# Patient Record
Sex: Female | Born: 1965 | Race: White | Hispanic: No | Marital: Married
Health system: Southern US, Community
[De-identification: ages and names within clinical notes are randomized; demographics above are authoritative.]

---

## 1999-07-06 ENCOUNTER — Inpatient Hospital Stay (HOSPITAL_COMMUNITY): Admission: AD | Admit: 1999-07-06 | Discharge: 1999-07-08 | Payer: Self-pay | Admitting: Obstetrics and Gynecology

## 2018-10-11 ENCOUNTER — Other Ambulatory Visit (HOSPITAL_COMMUNITY)
Admission: RE | Admit: 2018-10-11 | Discharge: 2018-10-11 | Disposition: A | Discharge status: Home / Self Care | Payer: BC Managed Care – PPO | Source: Ambulatory Visit | Attending: Family Medicine | Admitting: Family Medicine

## 2018-10-11 ENCOUNTER — Other Ambulatory Visit: Payer: Self-pay | Admitting: Family Medicine

## 2018-10-11 DIAGNOSIS — Z01419 Encounter for gynecological examination (general) (routine) without abnormal findings: Secondary | ICD-10-CM | POA: Insufficient documentation

## 2018-10-12 LAB — CYTOLOGY - PAP: Diagnosis: NEGATIVE

## 2020-01-28 ENCOUNTER — Ambulatory Visit: Payer: BC Managed Care – PPO

## 2020-10-09 ENCOUNTER — Emergency Department: Payer: No Typology Code available for payment source

## 2020-10-09 ENCOUNTER — Other Ambulatory Visit: Payer: Self-pay

## 2020-10-09 ENCOUNTER — Emergency Department
Admission: EM | Admit: 2020-10-09 | Discharge: 2020-10-09 | Disposition: A | Discharge status: Home / Self Care | Payer: No Typology Code available for payment source | Attending: Emergency Medicine | Admitting: Emergency Medicine

## 2020-10-09 DIAGNOSIS — Y92212 Middle school as the place of occurrence of the external cause: Secondary | ICD-10-CM | POA: Diagnosis not present

## 2020-10-09 DIAGNOSIS — S82142A Displaced bicondylar fracture of left tibia, initial encounter for closed fracture: Secondary | ICD-10-CM | POA: Diagnosis not present

## 2020-10-09 DIAGNOSIS — W010XXA Fall on same level from slipping, tripping and stumbling without subsequent striking against object, initial encounter: Secondary | ICD-10-CM | POA: Diagnosis not present

## 2020-10-09 DIAGNOSIS — M25562 Pain in left knee: Secondary | ICD-10-CM | POA: Diagnosis present

## 2020-10-09 DIAGNOSIS — Y990 Civilian activity done for income or pay: Secondary | ICD-10-CM | POA: Diagnosis not present

## 2020-10-09 MED ORDER — OXYCODONE-ACETAMINOPHEN 5-325 MG PO TABS
1.0000 | ORAL_TABLET | Freq: Four times a day (QID) | ORAL | 0 refills | Status: AC | PRN
Start: 2020-10-09 — End: 2020-10-14

## 2020-10-09 MED ORDER — MELOXICAM 15 MG PO TABS: 15.0000 mg | ORAL_TABLET | Freq: Every day | ORAL | 0 refills | Status: AC

## 2020-10-09 NOTE — ED Triage Notes (Signed)
Pt arrives [facility] from a powder puff football game. Pt fell during game, noted L knee swelling. Unable to bear weight or bend knee. Ice pack applied. Denies LOC. A&O upon arrival.

## 2020-10-10 NOTE — ED Provider Notes (Signed)
[facility] Emergency Department Provider Note  ____________________________________________   First MD Initiated Contact with Patient 10/09/20 2057     (approximate)  I have reviewed the triage vital signs and the nursing notes.   HISTORY  Chief Complaint Knee Pain  HPI Jill Mccullough is a 54 y.o. female since the emergency department via EMS for evaluation of left knee pain.  The patient states that she was participating in a powder puff football game at a [employer] where she works, when someone grabbed her flag and her left leg hit some mud and she fell awkwardly.  She tried to get up, but immediately noticed that she was unable to weight-bear on the left side.  She was then brought into [place].  She has not had anything to treat the pain up until this point.  Worse with ambulation and range of motion.  Pain in the room is rated a 4/10 so long as she is at rest.  No history of prior injury to the left knee.  She denies any complaints in her hip or ankle.        History reviewed. No pertinent past medical history.  There are no problems to display for this patient.   History reviewed. No pertinent surgical history.  Prior to Admission medications   Medication Sig Start Date End Date Taking? Authorizing Provider  meloxicam (MOBIC) 15 MG tablet Take 1 tablet (15 mg total) by mouth daily. 10/09/20 11/08/20  Lucy Chris, PA  oxyCODONE-acetaminophen (PERCOCET) 5-325 MG tablet Take 1 tablet by mouth every 6 (six) hours as needed for up to 5 days for severe pain. 10/09/20 10/14/20  Lucy Chris, PA    Allergies Patient has no known allergies.  History reviewed. No pertinent family history.  Social History Social History   Tobacco Use  . Smoking status: Not on file  Substance Use Topics  . Alcohol use: Yes    Comment: occassional   . Drug use: Not on file    Review of Systems Constitutional: No fever/chills Eyes: No visual  changes. ENT: No sore throat. Cardiovascular: Denies chest pain. Respiratory: Denies shortness of breath. Gastrointestinal: No abdominal pain.  No nausea, no vomiting.  No diarrhea.  No constipation. Genitourinary: Negative for dysuria. Musculoskeletal: + Left knee pain, left knee swelling Skin: Negative for rash. Neurological: Negative for headaches, focal weakness or numbness.  ____________________________________________   PHYSICAL EXAM:  VITAL SIGNS: ED Triage Vitals  Enc Vitals Group     BP 10/09/20 1858 (!) 199/88     Pulse Rate 10/09/20 1859 76     Resp 10/09/20 1858 16     Temp 10/09/20 1858 97.8 F (36.6 C)     Temp Source 10/09/20 1858 Oral     SpO2 10/09/20 1858 98 %     Weight 10/09/20 1859 142 lb (64.4 kg)     Height 10/09/20 1859 5\' 6"  (1.676 m)     Head Circumference --      Peak Flow --      Pain Score 10/09/20 1858 3     Pain Loc --      Pain Edu? --      Excl. in GC? --     Constitutional: Alert and oriented. Well appearing and in no acute distress. Eyes: Conjunctivae are normal. PERRL. EOMI. Head: Atraumatic. Neck: No stridor.   Cardiovascular: Normal rate, regular rhythm. Grossly normal heart sounds.  Good peripheral circulation. Respiratory: Normal respiratory effort.  No  retractions. Lungs CTAB. Gastrointestinal: Soft and nontender. No distention. No abdominal bruits. No CVA tenderness. Musculoskeletal: There is an obvious moderate joint effusion present about the left knee.  No ecchymosis present.  There is tenderness to palpation over the anterior aspect diffusely.  Range of motion not attempted secondary to known x-ray findings.  Exam of the left ankle reveals full range of motion and nontender.  Dorsalis pedis and posterior tibial pulses 2+. Neurologic:  Normal speech and language. No gross focal neurologic deficits are appreciated.  Gait not assessed secondary to known x-ray findings. Skin:  Skin is warm, dry and intact. No rash  noted. Psychiatric: Mood and affect are normal. Speech and behavior are normal.  ____________________________________________  RADIOLOGY I, Lucy Chris, personally viewed and evaluated these images (plain radiographs) as part of my medical decision making, as well as reviewing the written report by the radiologist.  ED provider interpretation: X-rays redemonstrate joint effusion of the left knee as well as concern for tibial plateau fracture.  Official radiology report(s): CT Knee Left Wo Contrast  Result Date: 10/09/2020 CLINICAL DATA:  Larey Seat, left knee swelling, inability to bear weight EXAM: CT OF THE LEFT KNEE WITHOUT CONTRAST TECHNIQUE: Multidetector CT imaging of the LEFT knee was performed according to the standard protocol. Multiplanar CT image reconstructions were also generated. COMPARISON:  10/09/2020 FINDINGS: Bones/Joint/Cartilage There is a minimally depressed and comminuted intra-articular fracture involving the lateral tibial plateau. Less than 1 mm of depression of the articular surface of the lateral tibial plateau. Oblique fracture line extends from the tibial spines through the posterolateral metadiaphyseal junction. No other acute bony abnormalities.  Joint spaces are well preserved. Moderate lipohemarthrosis is identified in the suprapatellar region. Ligaments Suboptimally assessed by CT. Muscles and Tendons No acute process. Soft tissues Unremarkable. IMPRESSION: 1. Minimally depressed and comminuted intra-articular fracture of the lateral tibial plateau, extending from the tibial spines through the posterolateral metadiaphyseal junction. Less than 1 mm of depression of the articular surface at the fracture site. 2. Moderate lipohemarthrosis. Electronically Signed   By: Sharlet Salina M.D.   On: 10/09/2020 21:08   DG Knee Complete 4 Views Left  Result Date: 10/09/2020 CLINICAL DATA:  Pain status post fall EXAM: LEFT KNEE - COMPLETE 4+ VIEW COMPARISON:  None. FINDINGS:  There is a moderate to large joint effusion with findings suspicious for lipohemarthrosis. There is an oblique lucency involving the proximal lateral tibia only visualized on a single oblique view. There are minimal degenerative changes. IMPRESSION: 1. Moderate to large joint effusion with findings suspicious for lipohemarthrosis. 2. Oblique lucency through the proximal lateral tibia is suspicious for nondisplaced fracture, likely extending into the lateral tibial plateau. CT would be useful for further evaluation. Electronically Signed   By: Katherine Mantle M.D.   On: 10/09/2020 19:28    ____________________________________________   INITIAL IMPRESSION / ASSESSMENT AND PLAN / ED COURSE  As part of my medical decision making, I reviewed the following data within the electronic MEDICAL RECORD NUMBER Nursing notes reviewed and incorporated, Radiograph reviewed and [place] Controlled Substance Database        Patient is a 54 year old female who presents to the emergency department via EMS for evaluation of left knee following a trauma while participating in a powder puff football game.  See HPI for further details.  X-ray examination reveals concern for possible tibial plateau fracture and recommended CT follow-up.  CT was ordered which reveals lateral tibial fracture that is comminuted in nature and depressed approximately 1  mm.  The patient will be placed in a straight leg immobilizer and nonweightbearing on crutches until she can have further evaluation with orthopedics.  Initially she declines desire for any narcotic pain medication, but did accept a prescription if her pain worsens.  She was advised that she could use alternating anti-inflammatory and Tylenol for her symptoms in the interim.  The patient is in agreement with this plan and is stable at this time for outpatient therapy.      ____________________________________________   FINAL CLINICAL IMPRESSION(S) / ED DIAGNOSES  Final diagnoses:   Closed fracture of left tibial plateau, initial encounter     ED Discharge Orders         Ordered    meloxicam (MOBIC) 15 MG tablet  Daily        10/09/20 2135    oxyCODONE-acetaminophen (PERCOCET) 5-325 MG tablet  Every 6 hours PRN        10/09/20 2135          *Please note:  Jill Mccullough was evaluated in Emergency Department on 10/10/2020 for the symptoms described in the history of present illness. She was evaluated in the context of the global COVID-19 pandemic, which necessitated consideration that the patient might be at risk for infection with the SARS-CoV-2 virus that causes COVID-19. Institutional protocols and algorithms that pertain to the evaluation of patients at risk for COVID-19 are in a state of rapid change based on information released by regulatory bodies including the CDC and federal and state organizations. These policies and algorithms were followed during the patient's care in the ED.  Some ED evaluations and interventions may be delayed as a result of limited staffing during and the pandemic.*   Note:  This document was prepared using Dragon voice recognition software and may include unintentional dictation errors.    Lucy Chris, PA 10/10/20 Luiz Iron    Chesley Noon, MD 10/13/20 [phone]

## 2022-04-05 IMAGING — CT CT KNEE*L* W/O CM
3 series · 16 of 35 positions shown, 19 images · non-contrast
Comparison: 10/09/2020

CLINICAL DATA: Fell, left knee swelling, inability to bear weight

EXAM:
CT OF THE LEFT KNEE WITHOUT CONTRAST
TECHNIQUE: Multidetector CT imaging of the LEFT knee was performed according to
the standard protocol. Multiplanar CT image reconstructions were
also generated.

[Series 7: axial st · axial · 0.38mm/px · z∈[-842,-691]mm · 8 of 121 slices shown, 10 images]
[im 10/121  soft-tissue]
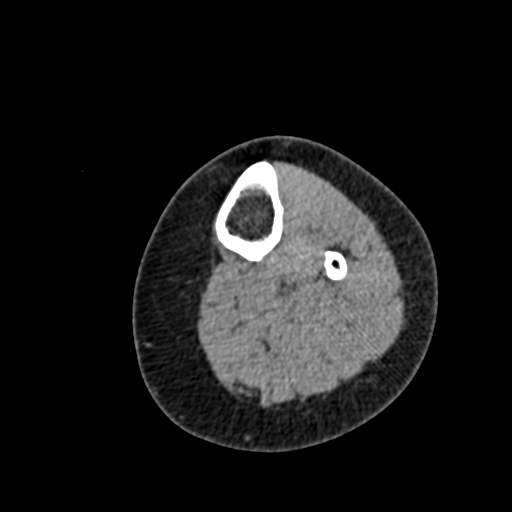
[im 10/121  bone]
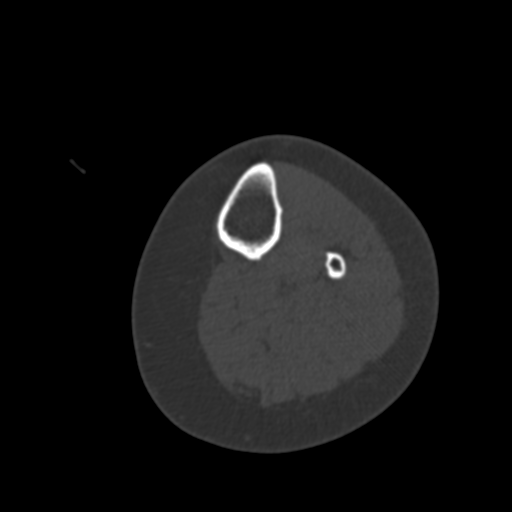
[im 28/121  bone]
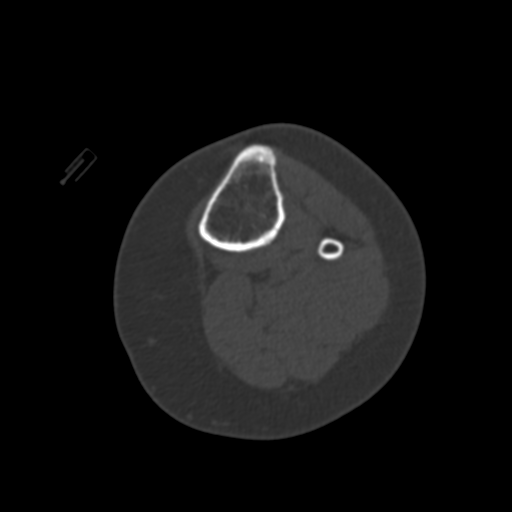
[im 37/121  bone]
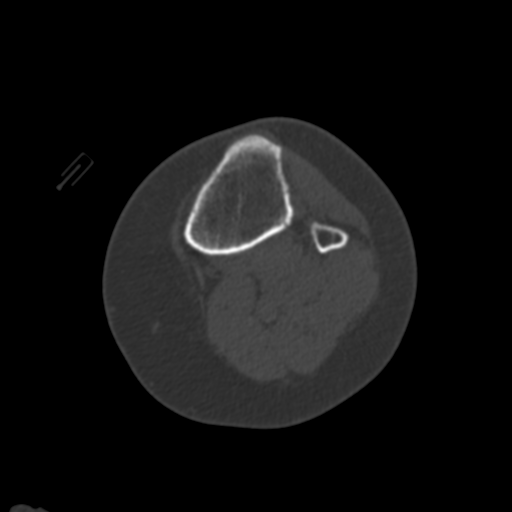
[im 56/121  bone]
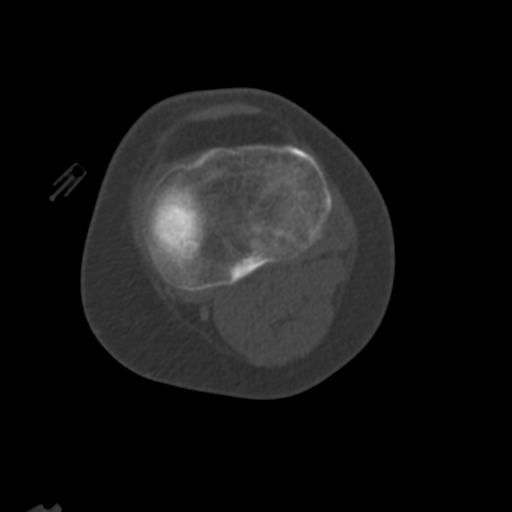
[im 65/121  soft-tissue]
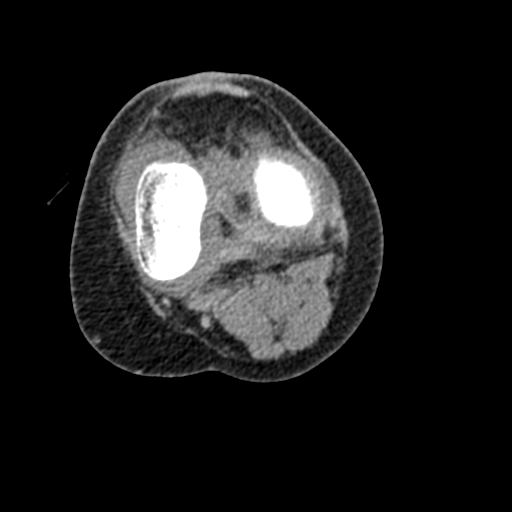
[im 65/121  bone]
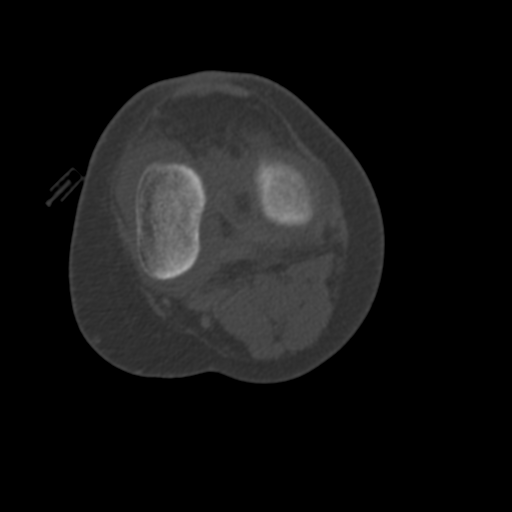
[im 84/121  bone]
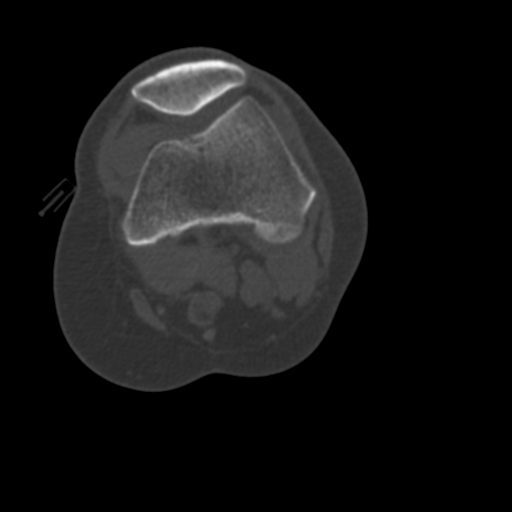
[im 93/121  bone]
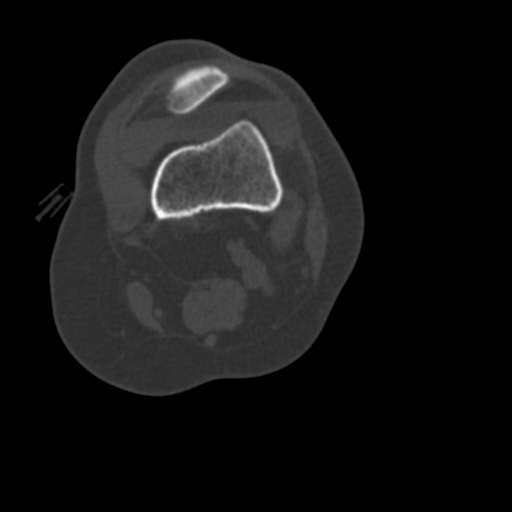
[im 111/121  bone]
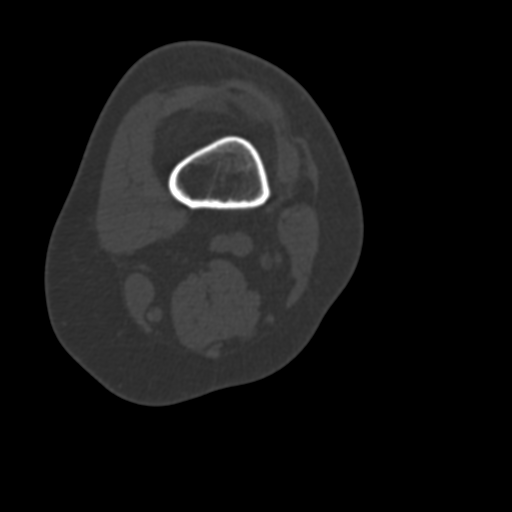

[Series 8: coronal st · coronal · 0.30mm/px · 3 of 93 slices shown]
[im 19/93  bone]
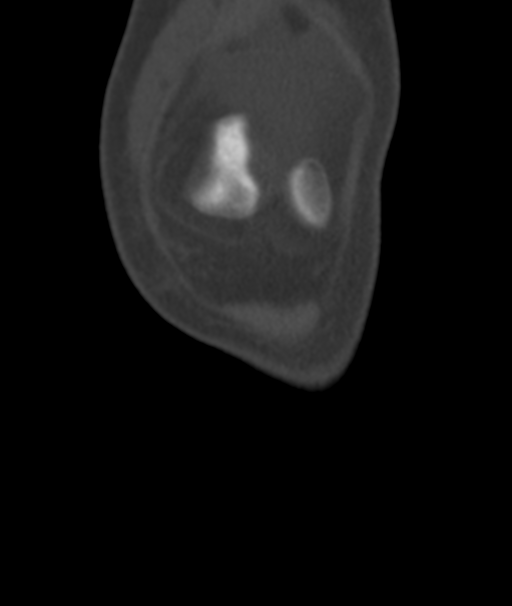
[im 37/93  bone]
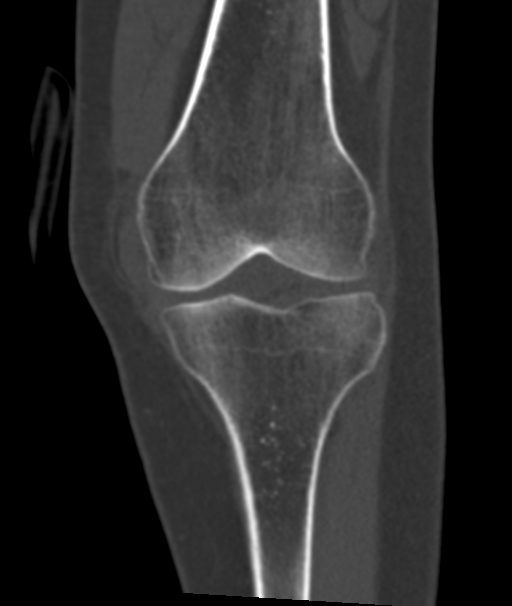
[im 56/93  bone]
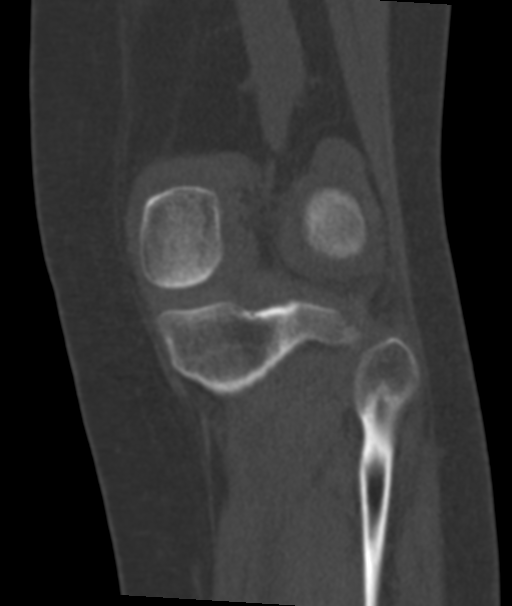

[Series 9: sagittal st · sagittal · 0.35mm/px · 5 of 84 slices shown, 6 images]
[im 28/84  bone]
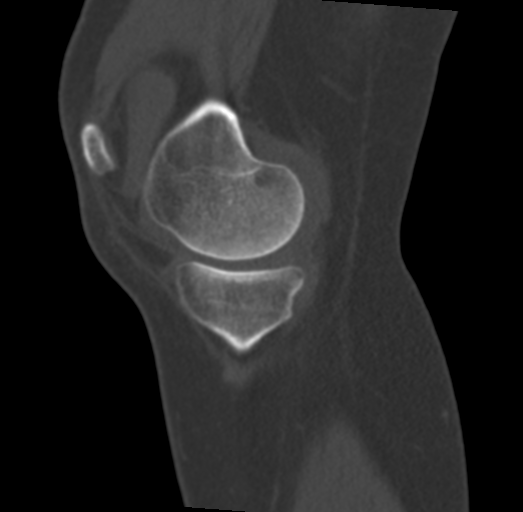
[im 35/84  bone]
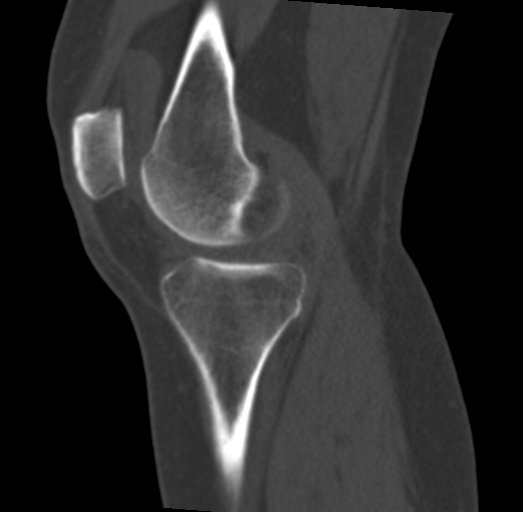
[im 42/84  soft-tissue]
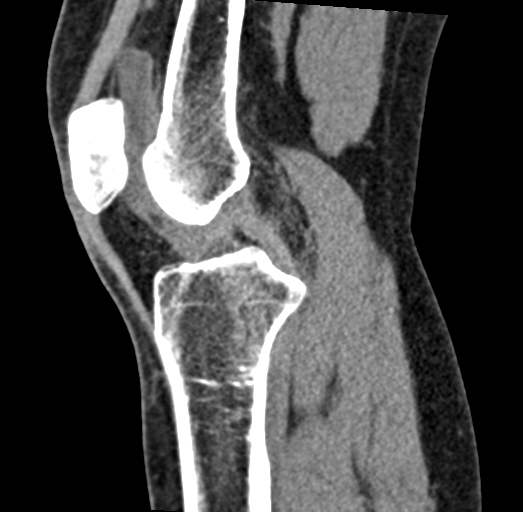
[im 42/84  bone]
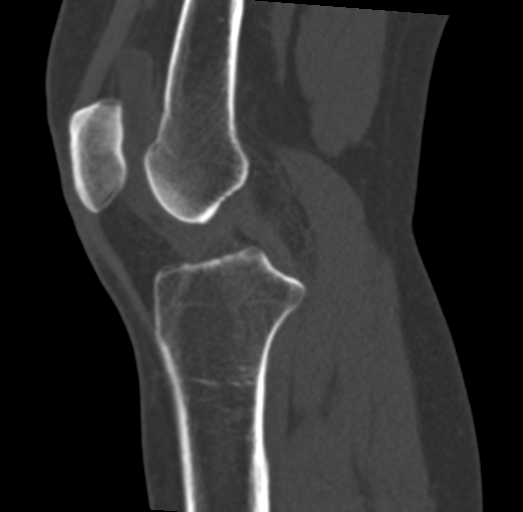
[im 49/84  bone]
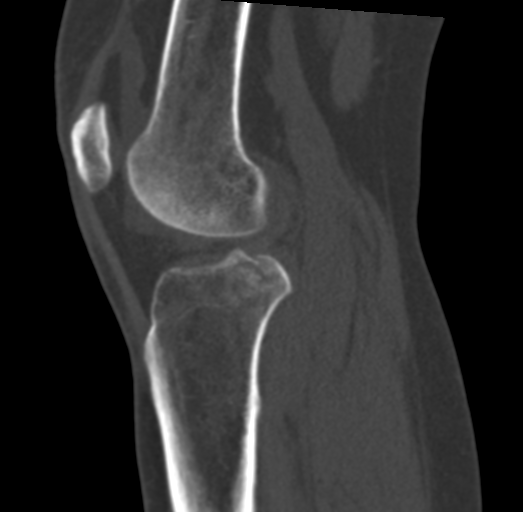
[im 56/84  bone]
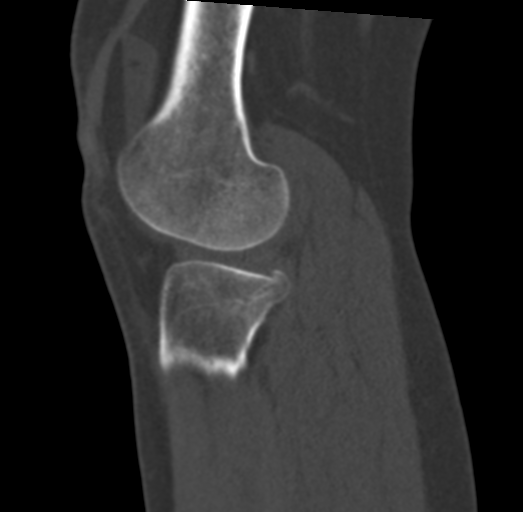

[16 of 35 positions shown; findings below may reference images not displayed]

FINDINGS: Bones/Joint/Cartilage

There is a minimally depressed and comminuted intra-articular
fracture involving the lateral tibial plateau. Less than 1 mm of
depression of the articular surface of the lateral tibial plateau.
Oblique fracture line extends from the tibial spines through the
posterolateral metadiaphyseal junction.

No other acute bony abnormalities.  Joint spaces are well preserved.

Moderate lipohemarthrosis is identified in the suprapatellar region.

Ligaments

Suboptimally assessed by CT.

Muscles and Tendons

No acute process.

Soft tissues

Unremarkable.
IMPRESSION: 1. Minimally depressed and comminuted intra-articular fracture of
the lateral tibial plateau, extending from the tibial spines through
the posterolateral metadiaphyseal junction. Less than 1 mm of
depression of the articular surface at the fracture site.
2. Moderate lipohemarthrosis.
